# Patient Record
Sex: Female | Born: 1970 | Race: White | Smoking: Never smoker
Health system: Southern US, Community
[De-identification: ages and names within clinical notes are randomized; demographics above are authoritative.]

## PROBLEM LIST (undated history)

## (undated) DIAGNOSIS — E669 Obesity, unspecified: Secondary | ICD-10-CM

## (undated) DIAGNOSIS — K219 Gastro-esophageal reflux disease without esophagitis: Secondary | ICD-10-CM

## (undated) HISTORY — DX: Gastro-esophageal reflux disease without esophagitis: K21.9

## (undated) HISTORY — PX: FOOT SURGERY: SHX648

## (undated) HISTORY — PX: TYMPANOSTOMY TUBE PLACEMENT: SHX32

## (undated) HISTORY — DX: Obesity, unspecified: E66.9

---

## 2011-01-09 ENCOUNTER — Other Ambulatory Visit: Payer: Self-pay | Admitting: Obstetrics and Gynecology

## 2011-01-09 DIAGNOSIS — Z1239 Encounter for other screening for malignant neoplasm of breast: Secondary | ICD-10-CM

## 2011-01-09 DIAGNOSIS — Z1231 Encounter for screening mammogram for malignant neoplasm of breast: Secondary | ICD-10-CM

## 2011-01-27 ENCOUNTER — Inpatient Hospital Stay (HOSPITAL_BASED_OUTPATIENT_CLINIC_OR_DEPARTMENT_OTHER): Admission: RE | Admit: 2011-01-27 | Payer: Self-pay | Source: Ambulatory Visit

## 2011-06-10 ENCOUNTER — Ambulatory Visit (INDEPENDENT_AMBULATORY_CARE_PROVIDER_SITE_OTHER): Payer: Self-pay | Admitting: Family Medicine

## 2011-06-10 ENCOUNTER — Encounter: Payer: Self-pay | Admitting: Family Medicine

## 2011-06-10 VITALS — BP 124/82 | HR 85 | Temp 97.9°F | Ht 64.0 in | Wt 159.0 lb

## 2011-06-10 DIAGNOSIS — M25562 Pain in left knee: Secondary | ICD-10-CM

## 2011-06-10 DIAGNOSIS — M25569 Pain in unspecified knee: Secondary | ICD-10-CM

## 2011-06-10 MED ORDER — MELOXICAM 15 MG PO TABS: 15.0000 mg | ORAL_TABLET | Freq: Every day | ORAL | Status: AC

## 2011-06-10 NOTE — Patient Instructions (Signed)
Your symptoms and exam are most consistent with a small medial meniscal tear. If we can calm down the inflammation and limit pinching this (with PT - quad strengthening especially), many people live with small ones without any further problems. Take meloxicam 15mg  daily with food for pain and inflammation. Ice knee 15 minutes at a time 3-4 times a day. ACE wrap or knee sleeve if swelling becomes more of an issue. Be careful with squats, lunges, twisting motions - these are most likely to aggravate this. Follow up with me in 1 month.  If not improving, next step is to move forward with an MRI.

## 2011-06-12 ENCOUNTER — Encounter: Payer: Self-pay | Admitting: Family Medicine

## 2011-06-12 DIAGNOSIS — M25562 Pain in left knee: Secondary | ICD-10-CM | POA: Insufficient documentation

## 2011-06-12 NOTE — Progress Notes (Signed)
  Subjective:    Patient ID: Angela Rojas, female    DOB: January 07, 1971, 40 y.o.   MRN: [id number]  PCP: Dr. Virginia Rochester  HPI 40 yo F here for left knee pain.  Patient denies a known injury or increase in activity level. Reports approximately 1 1/2 weeks ago developed pain in left knee that was more posterolateral at the time. No swelling, bruising. She had no catching, locking, or giving out though knee felt stiff. + popping as well but not painful. Has been taking ibuprofen and icing. Went to her NP on 8/8 - had x-rays showing probable mild effusion but no arthropathy. Was given an intraarticular cortisone injection that helped though now has pain posterolateral and medial across knee.  History reviewed. No pertinent past medical history.  No current outpatient prescriptions on file prior to visit.    History reviewed. No pertinent past surgical history.  Allergies  Allergen Reactions  . Clindamycin/Lincomycin   . Erythromycin     History   Social History  . Marital Status: Legally Separated    Spouse Name: N/A    Number of Children: N/A  . Years of Education: N/A   Occupational History  . Not on file.   Social History Main Topics  . Smoking status: Never Smoker   . Smokeless tobacco: Not on file  . Alcohol Use: Not on file  . Drug Use: Not on file  . Sexually Active: Not on file   Other Topics Concern  . Not on file   Social History Narrative  . No narrative on file    Family History  Problem Relation Age of Onset  . Diabetes Paternal Aunt   . Heart attack Paternal Grandfather   . Hyperlipidemia Neg Hx   . Hypertension Neg Hx   . Sudden death Neg Hx     BP 124/82  Pulse 85  Temp(Src) 97.9 F (36.6 C) (Oral)  Ht 5\' 4"  (1.626 m)  Wt 159 lb (72.122 kg)  BMI 27.29 kg/m2  Review of Systems See HPI above.    Objective:   Physical Exam Gen: NAD L knee: No gross deformity, ecchymoses, effusion. Mild medial joint line TTP.  No lateral joint line, post  patellar facet, pes, or other TTP about knee. FROM. Negative ant/post drawers. Negative valgus/varus testing. Negative lachmanns. Negative mcmurrays (minimal pain medially but no click), apleys, patellar apprehension, clarkes. NV intact distally.    Assessment & Plan:  1. Left knee pain - most likely 2/2 small degenerative medial meniscal tear.  X-rays without evidence of arthritis.  No injury, ligaments stable, exam otherwise reassuring.  Start PT with aggressive quad strengthening, mobic, icing.  Already given cortisone injection.  Should continue to improve over next 4-6 weeks.  F/u in 1 month. If not improving as expected, will move forward with MRI to further evaluate for meniscal tear.

## 2011-06-12 NOTE — Assessment & Plan Note (Signed)
most likely 2/2 small degenerative medial meniscal tear.  X-rays without evidence of arthritis.  No injury, ligaments stable, exam otherwise reassuring.  Start PT with aggressive quad strengthening, mobic, icing.  Already given cortisone injection.  Should continue to improve over next 4-6 weeks.  F/u in 1 month. If not improving as expected, will move forward with MRI to further evaluate for meniscal tear.

## 2011-06-18 ENCOUNTER — Ambulatory Visit: Payer: BC Managed Care – PPO | Attending: Family Medicine | Admitting: Physical Therapy

## 2011-06-18 DIAGNOSIS — M25569 Pain in unspecified knee: Secondary | ICD-10-CM | POA: Insufficient documentation

## 2011-06-18 DIAGNOSIS — IMO0001 Reserved for inherently not codable concepts without codable children: Secondary | ICD-10-CM | POA: Insufficient documentation

## 2011-06-23 ENCOUNTER — Ambulatory Visit: Payer: BC Managed Care – PPO | Admitting: Physical Therapy

## 2011-06-25 ENCOUNTER — Ambulatory Visit: Payer: BC Managed Care – PPO | Admitting: Physical Therapy

## 2011-06-30 ENCOUNTER — Ambulatory Visit: Payer: BC Managed Care – PPO | Attending: Family Medicine | Admitting: Physical Therapy

## 2011-06-30 DIAGNOSIS — IMO0001 Reserved for inherently not codable concepts without codable children: Secondary | ICD-10-CM | POA: Insufficient documentation

## 2011-06-30 DIAGNOSIS — M25569 Pain in unspecified knee: Secondary | ICD-10-CM | POA: Insufficient documentation

## 2011-07-02 ENCOUNTER — Encounter: Payer: BC Managed Care – PPO | Admitting: Rehabilitation

## 2011-07-08 ENCOUNTER — Ambulatory Visit: Payer: BC Managed Care – PPO | Admitting: Physical Therapy

## 2011-07-09 ENCOUNTER — Ambulatory Visit: Payer: BC Managed Care – PPO | Admitting: Physical Therapy

## 2011-07-10 ENCOUNTER — Encounter: Payer: Self-pay | Admitting: Family Medicine

## 2011-07-10 ENCOUNTER — Ambulatory Visit (INDEPENDENT_AMBULATORY_CARE_PROVIDER_SITE_OTHER): Payer: BC Managed Care – PPO | Admitting: Family Medicine

## 2011-07-10 VITALS — BP 128/88 | HR 86 | Temp 98.3°F | Ht 64.0 in | Wt 159.0 lb

## 2011-07-10 DIAGNOSIS — M25562 Pain in left knee: Secondary | ICD-10-CM

## 2011-07-10 DIAGNOSIS — M25569 Pain in unspecified knee: Secondary | ICD-10-CM

## 2011-07-10 NOTE — Progress Notes (Addendum)
Subjective:    Patient ID: Angela Rojas, female    DOB: 1971/02/09, 40 y.o.   MRN: [id number]  PCP: Dr. Virginia Rochester  HPI  40 yo F here for 1 month f/u left knee pain.  8/15: Patient denies a known injury or increase in activity level. Reports approximately 1 1/2 weeks ago developed pain in left knee that was more posterolateral at the time. No swelling, bruising. She had no catching, locking, or giving out though knee felt stiff. + popping as well but not painful. Has been taking ibuprofen and icing. Went to her NP on 8/8 - had x-rays showing probable mild effusion but no arthropathy. Was given an intraarticular cortisone injection that helped though now has pain posterolateral and medial across knee.  9/14: Patient has been doing PT regularly for past 1 month. Had some mild improvement but pain still is a 4-6/10 and has some very bad days where she is limping. Pain is medial and posterior within this knee. No catching, locking. + instability and trouble fully extending knee. Pain worse with deep squats, deep lunges, and trying to get to full flexion or extension. No swelling. Not using any nsaids (was prescribed mobic - not much help). Icing occasionally. Not much help from intraarticular cortisone injection.  History reviewed. No pertinent past medical history.  Current Outpatient Prescriptions on File Prior to Visit  Medication Sig Dispense Refill  . meloxicam (MOBIC) 15 MG tablet Take 1 tablet (15 mg total) by mouth daily. With food.  30 tablet  1  . omeprazole (PRILOSEC) 40 MG capsule         History reviewed. No pertinent past surgical history.  Allergies  Allergen Reactions  . Clindamycin/Lincomycin   . Erythromycin     History   Social History  . Marital Status: Legally Separated    Spouse Name: N/A    Number of Children: N/A  . Years of Education: N/A   Occupational History  . Not on file.   Social History Main Topics  . Smoking status: Never Smoker   .  Smokeless tobacco: Not on file  . Alcohol Use: Not on file  . Drug Use: Not on file  . Sexually Active: Not on file   Other Topics Concern  . Not on file   Social History Narrative  . No narrative on file    Family History  Problem Relation Age of Onset  . Diabetes Paternal Aunt   . Heart attack Paternal Grandfather   . Hyperlipidemia Neg Hx   . Hypertension Neg Hx   . Sudden death Neg Hx     BP 128/88  Pulse 86  Temp(Src) 98.3 F (36.8 C) (Oral)  Ht 5\' 4"  (1.626 m)  Wt 159 lb (72.122 kg)  BMI 27.29 kg/m2  Review of Systems  See HPI above.    Objective:   Physical Exam  Gen: NAD L knee: No gross deformity, ecchymoses, effusion. Mod medial joint line TTP.  No lateral joint line, post patellar facet, pes, or other TTP about knee. Lacks 5 degrees of extension.  Flexion to 110 degrees with pain at full extent of flexion. Negative ant/post drawers. Negative valgus/varus testing. Negative lachmanns. Mildly positive mcmurrays medially (mild pain medially but no click), negative apleys, patellar apprehension, clarkes. NV intact distally.    Assessment & Plan:  1. Left knee pain - most likely 2/2 degenerative medial meniscal tear.  X-rays without evidence of arthritis.  No injury, ligaments stable, exam otherwise reassuring.  PT with  mild improvement but now feels back to where she was 1 month ago before starting PT.  Some very bad days.  Pain worse medially and with deep squats indicative of meniscal tear.  No improvement with cortisone injection.  Will move forward with MRI to further assess for medial meniscal tear.  Addendum:  MRI reviewed and discussed with patient.  No evidence of meniscal or ligamentous injury.  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  This should respond well to physical therapy, relative rest, and anti-inflammatories.  Advised her to continue with this and follow-up with me in 6 weeks.

## 2011-07-10 NOTE — Assessment & Plan Note (Signed)
most likely 2/2 degenerative medial meniscal tear.  X-rays without evidence of arthritis.  No injury, ligaments stable, exam otherwise reassuring.  PT with mild improvement but now feels back to where she was 1 month ago before starting PT.  Some very bad days.  Pain worse medially and with deep squats indicative of meniscal tear.  No improvement with cortisone injection.  Will move forward with MRI to further assess for medial meniscal tear.

## 2011-07-11 ENCOUNTER — Ambulatory Visit (HOSPITAL_BASED_OUTPATIENT_CLINIC_OR_DEPARTMENT_OTHER)
Admission: RE | Admit: 2011-07-11 | Discharge: 2011-07-11 | Disposition: A | Discharge status: Home / Self Care | Payer: BC Managed Care – PPO | Source: Ambulatory Visit | Attending: Family Medicine | Admitting: Family Medicine

## 2011-07-11 DIAGNOSIS — M25569 Pain in unspecified knee: Secondary | ICD-10-CM | POA: Insufficient documentation

## 2011-07-11 DIAGNOSIS — M25562 Pain in left knee: Secondary | ICD-10-CM

## 2011-07-11 DIAGNOSIS — M7120 Synovial cyst of popliteal space [Baker], unspecified knee: Secondary | ICD-10-CM

## 2011-07-11 DIAGNOSIS — M794 Hypertrophy of (infrapatellar) fat pad: Secondary | ICD-10-CM

## 2011-07-13 ENCOUNTER — Ambulatory Visit: Payer: BC Managed Care – PPO | Admitting: Family Medicine

## 2011-07-15 ENCOUNTER — Ambulatory Visit: Payer: BC Managed Care – PPO | Admitting: Physical Therapy

## 2011-07-16 ENCOUNTER — Encounter: Payer: BC Managed Care – PPO | Admitting: Physical Therapy

## 2011-07-20 ENCOUNTER — Ambulatory Visit: Payer: BC Managed Care – PPO | Admitting: Physical Therapy

## 2011-07-21 ENCOUNTER — Ambulatory Visit: Payer: BC Managed Care – PPO | Admitting: Physical Therapy

## 2011-07-22 ENCOUNTER — Ambulatory Visit: Payer: BC Managed Care – PPO | Admitting: Physical Therapy

## 2011-07-28 ENCOUNTER — Ambulatory Visit: Payer: BC Managed Care – PPO | Attending: Family Medicine | Admitting: Physical Therapy

## 2011-07-28 DIAGNOSIS — IMO0001 Reserved for inherently not codable concepts without codable children: Secondary | ICD-10-CM | POA: Insufficient documentation

## 2011-07-28 DIAGNOSIS — M25569 Pain in unspecified knee: Secondary | ICD-10-CM | POA: Insufficient documentation

## 2011-07-29 ENCOUNTER — Ambulatory Visit: Payer: BC Managed Care – PPO | Admitting: Physical Therapy

## 2011-08-06 ENCOUNTER — Ambulatory Visit: Payer: BC Managed Care – PPO | Admitting: Physical Therapy

## 2011-08-07 ENCOUNTER — Ambulatory Visit: Payer: BC Managed Care – PPO | Admitting: Physical Therapy

## 2011-08-11 ENCOUNTER — Ambulatory Visit: Payer: BC Managed Care – PPO | Admitting: Physical Therapy

## 2011-08-13 ENCOUNTER — Ambulatory Visit: Payer: BC Managed Care – PPO | Admitting: Physical Therapy

## 2011-08-17 ENCOUNTER — Ambulatory Visit: Payer: BC Managed Care – PPO | Admitting: Physical Therapy

## 2011-08-19 ENCOUNTER — Ambulatory Visit: Payer: BC Managed Care – PPO | Admitting: Physical Therapy

## 2011-08-24 ENCOUNTER — Encounter: Payer: BC Managed Care – PPO | Admitting: Physical Therapy

## 2011-08-28 ENCOUNTER — Ambulatory Visit: Payer: BC Managed Care – PPO | Attending: Family Medicine | Admitting: Physical Therapy

## 2011-08-28 DIAGNOSIS — M25569 Pain in unspecified knee: Secondary | ICD-10-CM | POA: Insufficient documentation

## 2011-08-28 DIAGNOSIS — IMO0001 Reserved for inherently not codable concepts without codable children: Secondary | ICD-10-CM | POA: Insufficient documentation

## 2011-09-03 ENCOUNTER — Ambulatory Visit: Payer: BC Managed Care – PPO | Admitting: Physical Therapy

## 2011-09-15 ENCOUNTER — Ambulatory Visit: Payer: BC Managed Care – PPO | Admitting: Physical Therapy

## 2011-09-24 ENCOUNTER — Encounter: Payer: BC Managed Care – PPO | Admitting: Physical Therapy

## 2012-02-15 ENCOUNTER — Other Ambulatory Visit (HOSPITAL_COMMUNITY)
Admission: RE | Admit: 2012-02-15 | Discharge: 2012-02-15 | Disposition: A | Discharge status: Home / Self Care | Payer: BC Managed Care – PPO | Source: Ambulatory Visit | Attending: Obstetrics and Gynecology | Admitting: Obstetrics and Gynecology

## 2012-02-15 DIAGNOSIS — Z113 Encounter for screening for infections with a predominantly sexual mode of transmission: Secondary | ICD-10-CM | POA: Insufficient documentation

## 2012-02-15 DIAGNOSIS — Z124 Encounter for screening for malignant neoplasm of cervix: Secondary | ICD-10-CM | POA: Insufficient documentation

## 2012-02-15 DIAGNOSIS — Z1159 Encounter for screening for other viral diseases: Secondary | ICD-10-CM | POA: Insufficient documentation

## 2014-04-04 ENCOUNTER — Encounter: Payer: Self-pay | Admitting: Internal Medicine

## 2014-06-01 ENCOUNTER — Other Ambulatory Visit (INDEPENDENT_AMBULATORY_CARE_PROVIDER_SITE_OTHER): Payer: BC Managed Care – PPO

## 2014-06-01 ENCOUNTER — Ambulatory Visit: Payer: BC Managed Care – PPO | Admitting: Internal Medicine

## 2014-06-01 ENCOUNTER — Encounter: Payer: Self-pay | Admitting: Physician Assistant

## 2014-06-01 ENCOUNTER — Ambulatory Visit (INDEPENDENT_AMBULATORY_CARE_PROVIDER_SITE_OTHER): Payer: BC Managed Care – PPO | Admitting: Physician Assistant

## 2014-06-01 VITALS — BP 112/80 | HR 76 | Ht 64.0 in | Wt 180.0 lb

## 2014-06-01 DIAGNOSIS — E063 Autoimmune thyroiditis: Secondary | ICD-10-CM | POA: Insufficient documentation

## 2014-06-01 DIAGNOSIS — D6800 Von Willebrand disease, unspecified: Secondary | ICD-10-CM

## 2014-06-01 DIAGNOSIS — K219 Gastro-esophageal reflux disease without esophagitis: Secondary | ICD-10-CM

## 2014-06-01 LAB — IRON AND TIBC
%SAT: 16 % — ABNORMAL LOW (ref 20–55)
Iron: 55 ug/dL (ref 42–145)
TIBC: 345 ug/dL (ref 250–470)
UIBC: 290 ug/dL (ref 125–400)

## 2014-06-01 LAB — MAGNESIUM: MAGNESIUM: 2 mg/dL (ref 1.5–2.5)

## 2014-06-01 LAB — IRON: Iron: 56 ug/dL (ref 42–145)

## 2014-06-01 NOTE — Patient Instructions (Signed)
Your physician has requested that you go to the basement for the following lab work before leaving today:  Magnesium, Iron, TIBC  You have been scheduled for an endoscopy. Please follow written instructions given to you at your visit today. If you use inhalers (even only as needed), please bring them with you on the day of your procedure. Your physician has requested that you go to www.[email or web address] and enter the access code given to you at your visit today. This web site gives a general overview about your procedure. However, you should still follow specific instructions given to you by our office regarding your preparation for the procedure.

## 2014-06-01 NOTE — Progress Notes (Signed)
Agree with assessment and plans 

## 2014-06-01 NOTE — Progress Notes (Signed)
Subjective:    Patient ID: Angela Rojas, female    DOB: July 20, 1971, 43 y.o.   MRN: [id number]  HPI Angela StanleyLisa is a pleasant 43 year old white female new to GI today referred by Dr. Andi Devonloward. Patient has long history of GERD dating back to her childhood. She also has history of obesity, has noticed thyroiditis and von Willebrand's disease. Patient reports that she remembers having reflux symptoms even as a child and says that in her teenage years she had heartburn on a daily basis. She did have an endoscopy done at age 43 and was told she had a hiatal hernia. She has required medication ever since that time and says she has been on PPI therapy for almost 20 years. She initially was on 20 mg of Prilosec and now has been on 40 mg of a murmurs all or Prilosec over the past 5-6 years. She says generally her symptoms are controlled occasionally will have breakthrough. She is unable Schipper medication even for a day or she has immediate breakthrough. Generally no problems with dysphagia or odynophagia may very occasionally have some solid food dysphagia which she attributes to her thyroid. No complaints of abdominal pain changes in bowel habits melena or hematochezia. She states that Dr. Andi Devonloward was concerned because of her long-term use of PPI therapy and long history of GERD. She does have history of von Willebrand's disease type is not clear. She says she has had no health issues as an adult and had a normal pregnancy and delivery without any bleeding issues.    Review of Systems  Constitutional: Negative.   HENT: Negative.   Eyes: Negative.   Respiratory: Negative.   Cardiovascular: Negative.   Gastrointestinal: Negative.   Endocrine: Negative.   Genitourinary: Negative.   Musculoskeletal: Negative.   Skin: Negative.   Allergic/Immunologic: Negative.   Neurological: Negative.   Hematological: Negative.   Psychiatric/Behavioral: Negative.    Outpatient Prescriptions Prior to Visit  Medication Sig  Dispense Refill  . omeprazole (PRILOSEC) 40 MG capsule       . phentermine 37.5 MG capsule Take 37.5 mg by mouth every morning.       No facility-administered medications prior to visit.       Allergies  Allergen Reactions  . Clindamycin/Lincomycin   . Erythromycin   . Azithromycin Rash   Patient Active Problem List   Diagnosis Date Noted  . GERD (gastroesophageal reflux disease) 06/01/2014  . Hashimoto's thyroiditis 06/01/2014  . Left knee pain 06/12/2011   History  Substance Use Topics  . Smoking status: Never Smoker   . Smokeless tobacco: Never Used  . Alcohol Use: No   family history includes Diabetes in her paternal aunt; Heart attack in her paternal grandfather. There is no history of Hyperlipidemia, Hypertension, or Sudden death.  Objective:   Physical Exam      Well-developed white female in no acute distress, pleasant blood pressure 112/80 pulse 78 height 5 foot 4 weight 180. HEENT; nontraumatic normocephalic EOMI PERRLA sclera anicteric,Neck; Supple no JVD, Cardiovascular; regular rate and rhythm with S1-S2 no murmur or gallop, Pulmonary; clear bilaterally, Abdomen; soft bowel sounds are present nontender no palpable mass or hepatosplenomegaly, Rectal; exam not done, Extremities; no clubbing cyanosis or edema skin warm and dry, Psych; mood and affect appropriate      Assessment & Plan:  #161    43 year old white female with very long history of chronic GERD and on PPI therapy for almost 20 years. Symptoms generally controlled with omeprazole 40 mg  by mouth daily. Concerns regarding long-term PPI use discussed with the patient including increased risk of magnesium and  Iron malabsorption and osteoporosis. #2Obesity #3 has noticed thyroiditis #4 history of von Willebrand's disease type unclear  Plan; Continue omeprazole 40 mg by mouth every morning for now-may want to give her a trial of twice daily H2 blocker at some point to see if she can get by on an H2  blocker Schedule for EGD with Dr. Garner Gavel discussed in detail with patient she is agreeable to proceed -rule out large hiatal hernia rule out Barrett's #3 antireflux regimen reviewed and education materials given #4 patient advised to obtain a baseline bone density study #5 check magnesium and iron levels today

## 2014-06-11 ENCOUNTER — Encounter: Payer: Self-pay | Admitting: Internal Medicine

## 2014-06-11 ENCOUNTER — Ambulatory Visit (AMBULATORY_SURGERY_CENTER): Payer: BC Managed Care – PPO | Admitting: Internal Medicine

## 2014-06-11 VITALS — BP 123/85 | HR 68 | Temp 98.4°F | Resp 22 | Ht 64.0 in | Wt 180.0 lb

## 2014-06-11 DIAGNOSIS — K219 Gastro-esophageal reflux disease without esophagitis: Secondary | ICD-10-CM

## 2014-06-11 MED ORDER — SODIUM CHLORIDE 0.9 % IV SOLN: 500.0000 mL | INTRAVENOUS | Status: DC

## 2014-06-11 NOTE — Op Note (Signed)
[facility] [address] [place], [id number]   ENDOSCOPY PROCEDURE REPORT  PATIENT: Angela RicksHackett, [name]  MR#: [id number] BIRTHDATE: 06/17/1971 , 42  yrs. old GENDER: Female ENDOSCOPIST: Roxy CedarJohn N Perry Jr, MD REFERRED BY:  Gabriel Earingavis Cloward, M.D. PROCEDURE DATE:  06/11/2014 PROCEDURE:  EGD, diagnostic ASA CLASS:     Class II INDICATIONS:  History of long-standing esophageal reflux. Requires PPI.   Barrett's screening. MEDICATIONS: MAC sedation, administered by CRNA and propofol (Diprivan) 180mg  IV TOPICAL ANESTHETIC: none  DESCRIPTION OF PROCEDURE: After the risks benefits and alternatives of the procedure were thoroughly explained, informed consent was obtained.  The LB [id number] endoscope was introduced through the mouth and advanced to the second portion of the duodenum. Without limitations.  The instrument was slowly withdrawn as the mucosa was fully examined.      EXAM:The upper, middle and distal third of the esophagus were carefully inspected and no abnormalities were noted.  The z-line was well seen at the GEJ.  The endoscope was pushed into the fundus which was normal including a retroflexed view.  The antrum, gastric body, first and second part of the duodenum were unremarkable. Retroflexed views revealed no abnormalities.     The scope was then withdrawn from the patient and the procedure completed.  COMPLICATIONS: There were no complications.  ENDOSCOPIC IMPRESSION: 1.Normal EGD  RECOMMENDATIONS: 1.  Anti-reflux regimen to be followed 2.  Continue PPI to control GERD symptoms  REPEAT EXAM:  eSigned:  Roxy CedarJohn N Perry Jr, MD 06/11/2014 9:38 AM   CC:The Patient and Gabriel Earingavis Cloward, MD

## 2014-06-11 NOTE — Patient Instructions (Signed)
YOU HAD AN ENDOSCOPIC PROCEDURE TODAY AT THE [facility]: Refer to the procedure report that was given to you for any specific questions about what was found during the examination.  If the procedure report does not answer your questions, please call your gastroenterologist to clarify.  If you requested that your care partner not be given the details of your procedure findings, then the procedure report has been included in a sealed envelope for you to review at your convenience later.  YOU SHOULD EXPECT: Some feelings of bloating in the abdomen. Passage of more gas than usual.  Walking can help get rid of the air that was put into your GI tract during the procedure and reduce the bloating.  DIET: Your first meal following the procedure should be a light meal and then it is ok to progress to your normal diet.  A half-sandwich or bowl of soup is an example of a good first meal.  Heavy or fried foods are harder to digest and may make you feel nauseous or bloated.  Likewise meals heavy in dairy and vegetables can cause extra gas to form and this can also increase the bloating.  Drink plenty of fluids but you should avoid alcoholic beverages for 24 hours.  ACTIVITY: Your care partner should take you home directly after the procedure.  You should plan to take it easy, moving slowly for the rest of the day.  You can resume normal activity the day after the procedure however you should NOT DRIVE or use heavy machinery for 24 hours (because of the sedation medicines used during the test).    SYMPTOMS TO REPORT IMMEDIATELY: A gastroenterologist can be reached at any hour.  During normal business hours, 8:30 AM to 5:00 PM Monday through Friday, call [phone].  After hours and on weekends, please call the GI answering service at [phone] who will take a message and have the physician on call contact you.   Following upper endoscopy (EGD)  Vomiting of blood or coffee ground material  New  chest pain or pain under the shoulder blades  Painful or persistently difficult swallowing  New shortness of breath  Fever of 100F or higher  Black, tarry-looking stools  FOLLOW UP: Our staff will call the home number listed on your records the next business day following your procedure to check on you and address any questions or concerns that you may have at that time regarding the information given to you following your procedure. This is a courtesy call and so if there is no answer at the home number and we have not heard from you through the emergency physician on call, we will assume that you have returned to your regular daily activities without incident.  SIGNATURES/CONFIDENTIALITY: You and/or your care partner have signed paperwork which will be entered into your electronic medical record.  These signatures attest to the fact that that the information above on your After Visit Summary has been reviewed and is understood.  Full responsibility of the confidentiality of this discharge information lies with you and/or your care-partner.  Normal endoscopy  Continue your normal medications  Please read handout about anti-reflux regimen

## 2014-06-11 NOTE — Progress Notes (Signed)
Report to PACU, RN, vss, BBS= Clear.  

## 2014-06-12 ENCOUNTER — Telehealth: Payer: Self-pay | Admitting: *Deleted

## 2014-06-12 NOTE — Telephone Encounter (Signed)
  Follow up Call-  Call back number 06/11/2014  Post procedure Call Back phone  # [phone]  Permission to leave phone message Yes     Patient questions:  Do you have a fever, pain , or abdominal swelling? No. Pain Score  0 *  Have you tolerated food without any problems? Yes.    Have you been able to return to your normal activities? Yes.    Do you have any questions about your discharge instructions: Diet   No. Medications  No. Follow up visit  No.  Do you have questions or concerns about your Care? Yes.    Actions: * If pain score is 4 or above: No action needed, pain <4. Patient stating she sneezed a lot yesterday and minimally today.patient stating she thought it may be related to her oxygen. Patient noted blackblood stain in her panties today. Patient stating has Merona, IUD, and occasionally has bloody show  Related to this. Denies any blood in bowel movement. Patient instructed to call with any dark tarry stools, abdominal pain, swelling, fever greater than 100 or bloody or coffee ground emesis.

## 2015-01-17 ENCOUNTER — Other Ambulatory Visit: Payer: Self-pay | Admitting: Internal Medicine

## 2015-01-17 DIAGNOSIS — R928 Other abnormal and inconclusive findings on diagnostic imaging of breast: Secondary | ICD-10-CM

## 2015-01-30 ENCOUNTER — Other Ambulatory Visit: Payer: Self-pay

## 2015-02-07 ENCOUNTER — Other Ambulatory Visit: Payer: Self-pay

## 2015-02-19 ENCOUNTER — Ambulatory Visit
Admission: RE | Admit: 2015-02-19 | Discharge: 2015-02-19 | Disposition: A | Discharge status: Home / Self Care | Payer: BLUE CROSS/BLUE SHIELD | Source: Ambulatory Visit | Attending: Internal Medicine | Admitting: Internal Medicine

## 2015-02-19 DIAGNOSIS — R928 Other abnormal and inconclusive findings on diagnostic imaging of breast: Secondary | ICD-10-CM

## 2017-06-24 ENCOUNTER — Other Ambulatory Visit: Payer: Self-pay | Admitting: Obstetrics & Gynecology

## 2017-06-24 DIAGNOSIS — R5381 Other malaise: Secondary | ICD-10-CM

## 2017-06-25 ENCOUNTER — Other Ambulatory Visit: Payer: Self-pay | Admitting: Obstetrics & Gynecology

## 2017-06-25 DIAGNOSIS — N6320 Unspecified lump in the left breast, unspecified quadrant: Secondary | ICD-10-CM

## 2017-06-30 ENCOUNTER — Ambulatory Visit
Admission: RE | Admit: 2017-06-30 | Discharge: 2017-06-30 | Disposition: A | Discharge status: Home / Self Care | Payer: BLUE CROSS/BLUE SHIELD | Source: Ambulatory Visit | Attending: Obstetrics & Gynecology | Admitting: Obstetrics & Gynecology

## 2017-06-30 DIAGNOSIS — N6320 Unspecified lump in the left breast, unspecified quadrant: Secondary | ICD-10-CM

## 2017-09-30 IMAGING — MG 2D DIGITAL DIAGNOSTIC BILATERAL MAMMOGRAM WITH CAD AND ADJUNCT T
8 of 15 series · 8 of 35 positions shown · non-contrast
Comparison: 02/19/2015 and 12/27/2014

CLINICAL DATA: Nontender palpable abnormality in the left breast
noted 1 [DATE] weeks ago.

EXAM:
2D DIGITAL DIAGNOSTIC BILATERAL MAMMOGRAM WITH CAD AND ADJUNCT TOMO
ULTRASOUND LEFT BREAST

[L MLO]
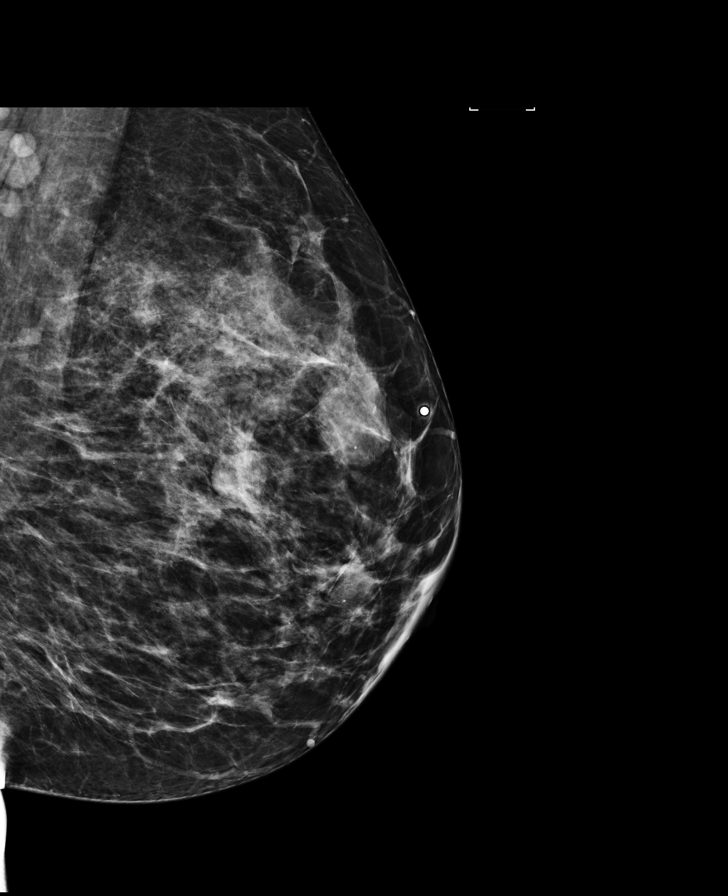

[L MLO synth-2D]
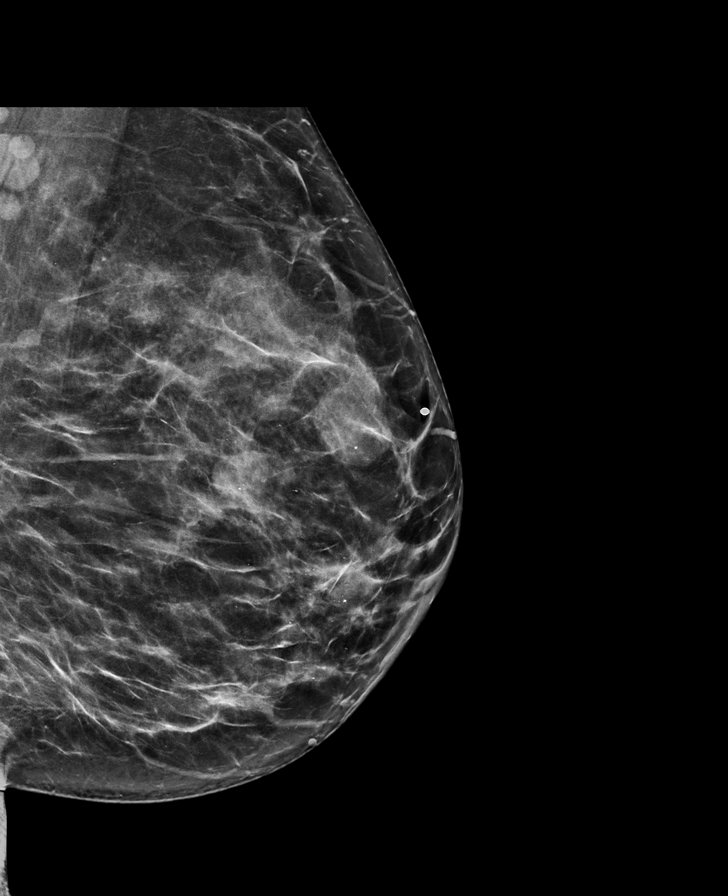

[L ML synth-2D]
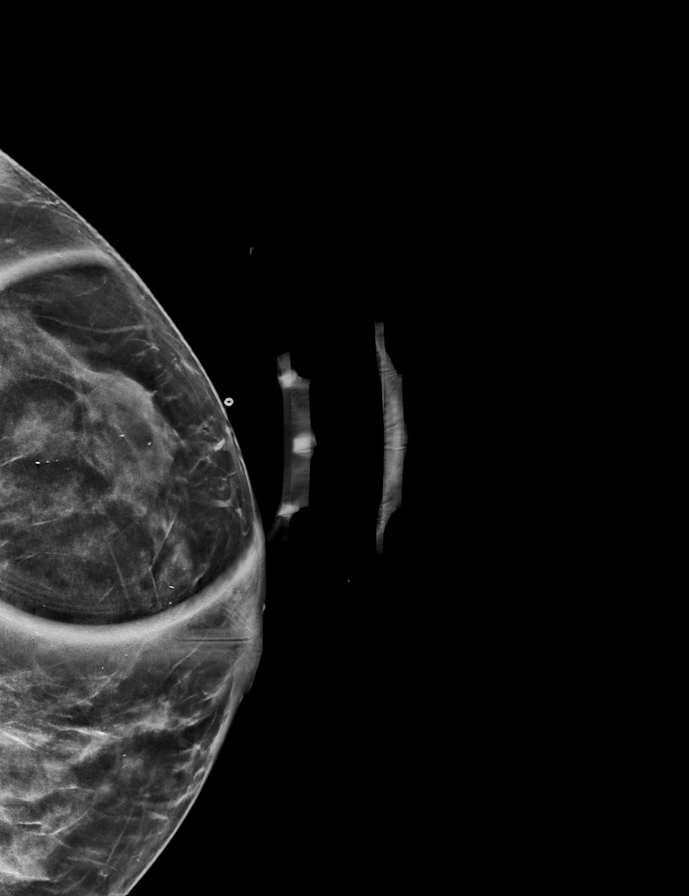

[R MLO synth-2D]
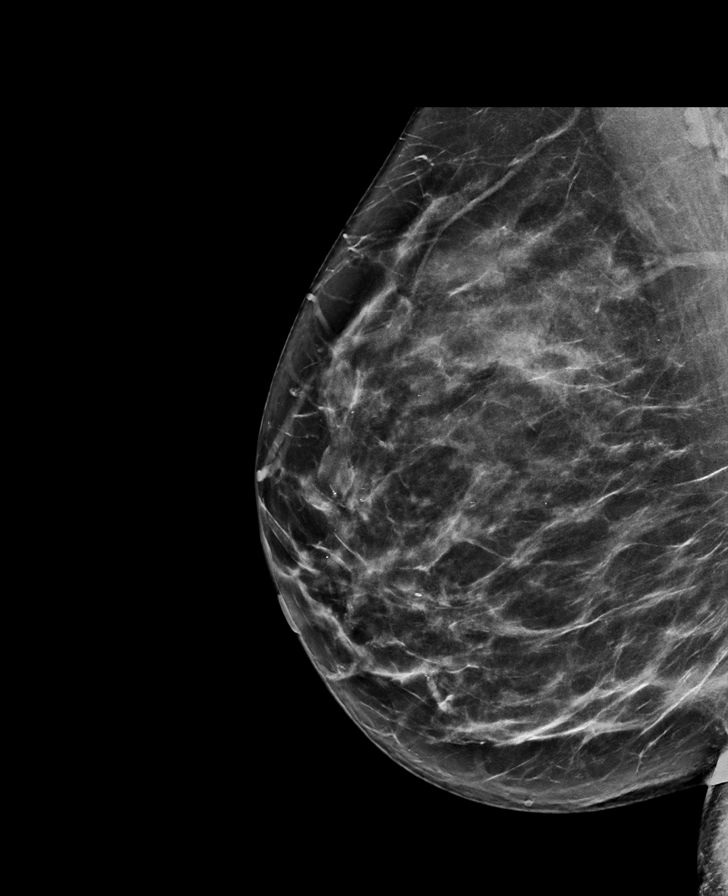

[L ML]
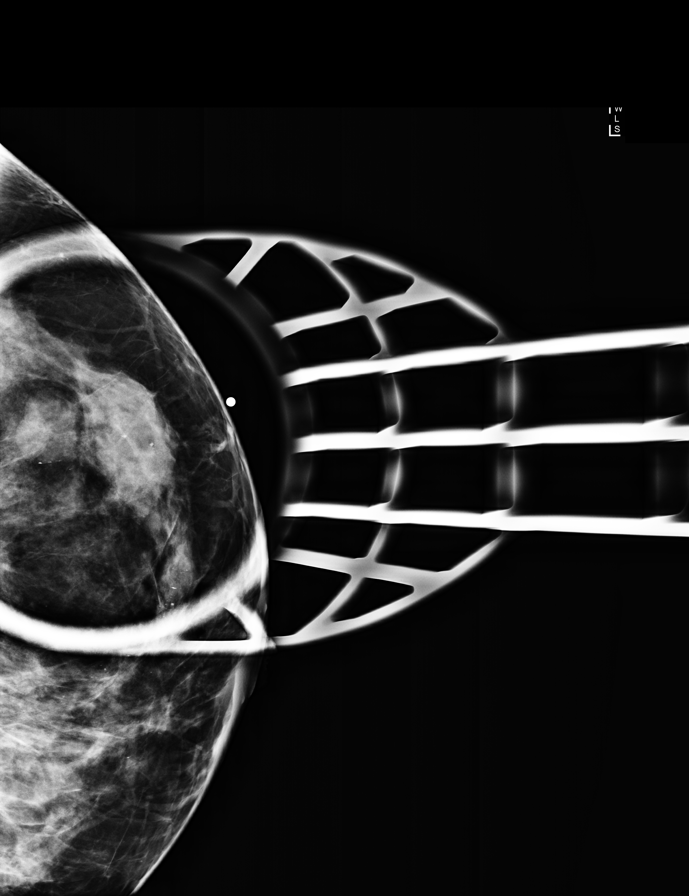

[R CC]
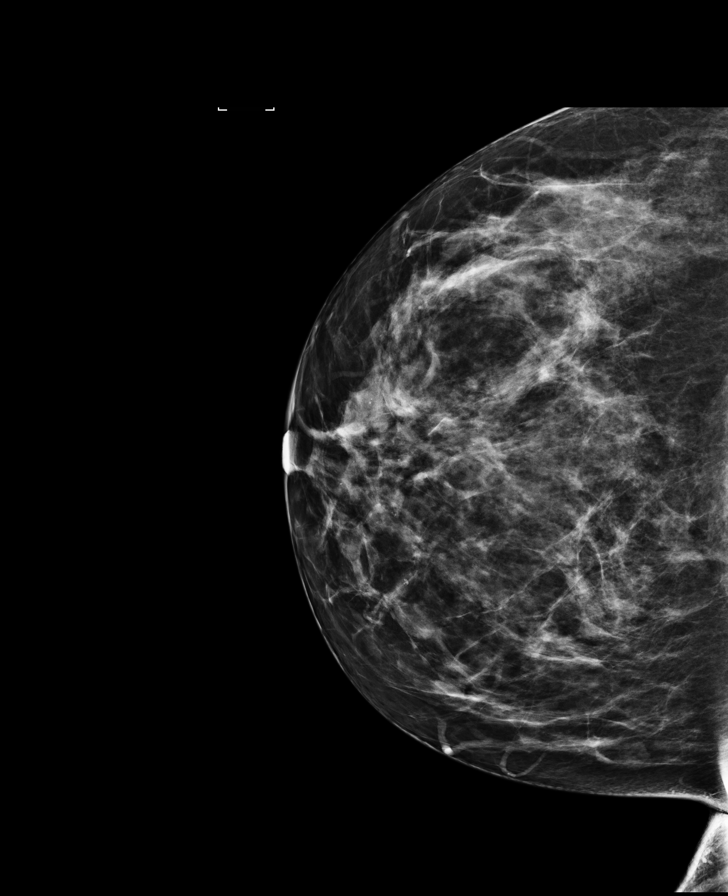

[L CC synth-2D]
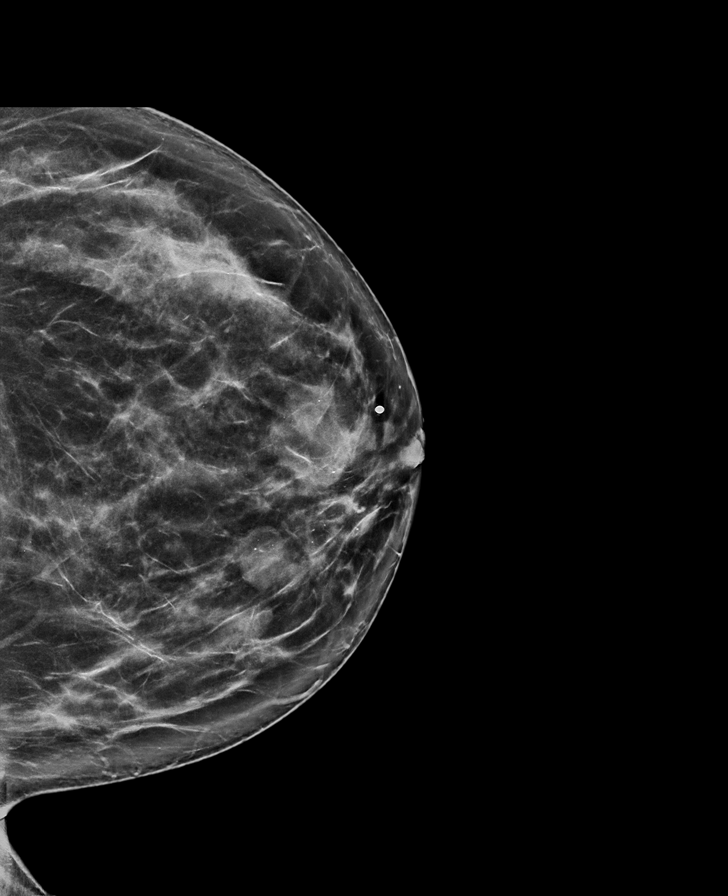

[R MLO]
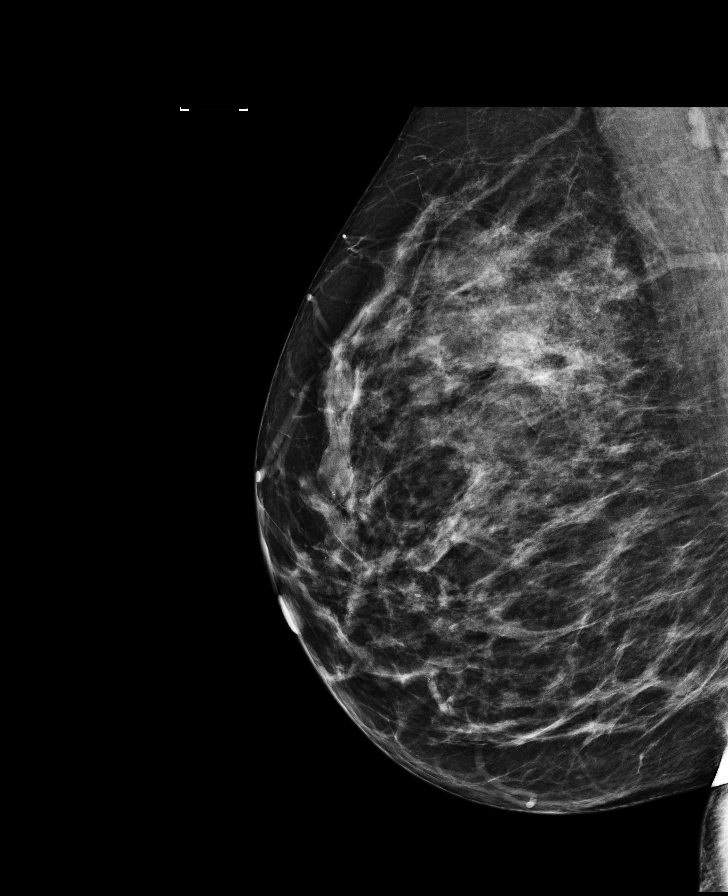

[8 of 35 positions shown; findings below may reference images not displayed]

ACR Breast Density Category c: The breast tissue is heterogeneously
dense, which may obscure small masses.
FINDINGS: Within the upper central portion of the left breast, there is an
oval partially obscured mass marked with a BB as palpable.
Additionally other small oval masses are noted in the upper inner
quadrant of the left breast and evaluated sonographically. No
suspicious findings are identified in the right breast.

Mammographic images were processed with CAD.

On physical exam, I palpate a rounded mobile nontender mass in the
12:30 o'clock location of the left breast.

Targeted ultrasound is performed, showing a circumscribed oval mass
in the 12:30 o'clock location left breast 4 cm from nipple which
measures 2.2 cm in greatest diameter. Mobile internal debris is
present and confirmed with decubitus imaging. Additional simple
cysts are identified in the 11 o'clock location, largest measuring
1.7 cm. In the 11 o'clock location 1 cm from the nipple, smaller
cysts measures 0.6 and 1.0 cm. No suspicious masses or areas of
acoustic shadowing are identified sonographically.
IMPRESSION: 1. Palpable abnormality in the left breast is a benign cyst by
ultrasound. We discussed the option of ultrasound-guided aspiration
as needed.
2. Additional simple cysts in the upper inner quadrant of the left
breast.
3.  No mammographic or ultrasound evidence for malignancy.

RECOMMENDATION:
Screening mammogram in one year.(Code:RI-O-KH4)

I have discussed the findings and recommendations with the patient.
Results were also provided in writing at the conclusion of the
visit. If applicable, a reminder letter will be sent to the patient
regarding the next appointment.

BI-RADS CATEGORY  2: Benign.

## 2021-05-29 ENCOUNTER — Other Ambulatory Visit: Payer: Self-pay | Admitting: Obstetrics & Gynecology

## 2021-05-29 DIAGNOSIS — M79629 Pain in unspecified upper arm: Secondary | ICD-10-CM

## 2021-06-16 ENCOUNTER — Other Ambulatory Visit: Payer: BLUE CROSS/BLUE SHIELD

## 2021-07-03 ENCOUNTER — Other Ambulatory Visit: Payer: Self-pay | Admitting: Obstetrics & Gynecology

## 2021-07-03 DIAGNOSIS — M79629 Pain in unspecified upper arm: Secondary | ICD-10-CM

## 2022-12-03 ENCOUNTER — Other Ambulatory Visit: Payer: Self-pay | Admitting: Internal Medicine

## 2022-12-03 DIAGNOSIS — R7989 Other specified abnormal findings of blood chemistry: Secondary | ICD-10-CM

## 2022-12-31 ENCOUNTER — Ambulatory Visit
Admission: RE | Admit: 2022-12-31 | Discharge: 2022-12-31 | Disposition: A | Discharge status: Home / Self Care | Payer: BC Managed Care – PPO | Source: Ambulatory Visit | Attending: Internal Medicine | Admitting: Internal Medicine

## 2022-12-31 DIAGNOSIS — R7989 Other specified abnormal findings of blood chemistry: Secondary | ICD-10-CM
